# Patient Record
Sex: Female | Born: 1940 | Race: White | Hispanic: No | Smoking: Former smoker
Health system: Southern US, Community
[De-identification: ages and names within clinical notes are randomized; demographics above are authoritative.]

## PROBLEM LIST (undated history)

## (undated) DIAGNOSIS — E785 Hyperlipidemia, unspecified: Secondary | ICD-10-CM

## (undated) DIAGNOSIS — N289 Disorder of kidney and ureter, unspecified: Secondary | ICD-10-CM

## (undated) DIAGNOSIS — I10 Essential (primary) hypertension: Secondary | ICD-10-CM

## (undated) DIAGNOSIS — G4730 Sleep apnea, unspecified: Secondary | ICD-10-CM

## (undated) DIAGNOSIS — K5730 Diverticulosis of large intestine without perforation or abscess without bleeding: Secondary | ICD-10-CM

## (undated) DIAGNOSIS — F419 Anxiety disorder, unspecified: Secondary | ICD-10-CM

## (undated) HISTORY — DX: Diverticulosis of large intestine without perforation or abscess without bleeding: K57.30

## (undated) HISTORY — DX: Sleep apnea, unspecified: G47.30

## (undated) HISTORY — DX: Essential (primary) hypertension: I10

## (undated) HISTORY — DX: Anxiety disorder, unspecified: F41.9

## (undated) HISTORY — PX: TOTAL ABDOMINAL HYSTERECTOMY W/ BILATERAL SALPINGOOPHORECTOMY: SHX83

## (undated) HISTORY — DX: Hyperlipidemia, unspecified: E78.5

---

## 1961-06-04 HISTORY — PX: SALPINGOOPHORECTOMY: SHX82

## 1961-06-04 HISTORY — PX: UTERINE SUSPENSION: SUR1430

## 2007-03-09 ENCOUNTER — Emergency Department (HOSPITAL_COMMUNITY): Admission: EM | Admit: 2007-03-09 | Discharge: 2007-03-09 | Payer: Self-pay | Admitting: Emergency Medicine

## 2007-08-02 ENCOUNTER — Emergency Department (HOSPITAL_COMMUNITY): Admission: EM | Admit: 2007-08-02 | Discharge: 2007-08-02 | Payer: Self-pay | Admitting: Emergency Medicine

## 2007-09-17 ENCOUNTER — Encounter: Admission: RE | Admit: 2007-09-17 | Discharge: 2007-09-17 | Payer: Self-pay | Admitting: Internal Medicine

## 2009-06-04 HISTORY — PX: CATARACT EXTRACTION: SUR2

## 2011-02-10 ENCOUNTER — Emergency Department (HOSPITAL_COMMUNITY)
Admission: EM | Admit: 2011-02-10 | Discharge: 2011-02-11 | Disposition: A | Discharge status: Home / Self Care | Payer: Medicare Other | Attending: Emergency Medicine | Admitting: Emergency Medicine

## 2011-02-10 DIAGNOSIS — I498 Other specified cardiac arrhythmias: Secondary | ICD-10-CM | POA: Insufficient documentation

## 2011-02-10 DIAGNOSIS — E785 Hyperlipidemia, unspecified: Secondary | ICD-10-CM | POA: Insufficient documentation

## 2011-02-10 DIAGNOSIS — R110 Nausea: Secondary | ICD-10-CM | POA: Insufficient documentation

## 2011-02-10 DIAGNOSIS — I10 Essential (primary) hypertension: Secondary | ICD-10-CM | POA: Insufficient documentation

## 2011-02-10 DIAGNOSIS — J45909 Unspecified asthma, uncomplicated: Secondary | ICD-10-CM | POA: Insufficient documentation

## 2011-02-10 LAB — BASIC METABOLIC PANEL
CO2: 29 mEq/L (ref 19–32)
Calcium: 9.3 mg/dL (ref 8.4–10.5)
Creatinine, Ser: 0.61 mg/dL (ref 0.50–1.10)
Glucose, Bld: 120 mg/dL — ABNORMAL HIGH (ref 70–99)

## 2011-02-10 LAB — CBC
HCT: 38.2 % (ref 36.0–46.0)
MCHC: 33.5 g/dL (ref 30.0–36.0)
MCV: 88.2 fL (ref 78.0–100.0)
RDW: 13 % (ref 11.5–15.5)

## 2011-02-10 LAB — DIFFERENTIAL
Basophils Absolute: 0 10*3/uL (ref 0.0–0.1)
Eosinophils Relative: 1 % (ref 0–5)
Lymphocytes Relative: 19 % (ref 12–46)
Monocytes Absolute: 0.7 10*3/uL (ref 0.1–1.0)

## 2011-02-10 LAB — POCT I-STAT TROPONIN I

## 2011-02-23 LAB — CBC
MCHC: 33.2
MCV: 89.2
Platelets: 373
RBC: 4.86

## 2011-02-23 LAB — COMPREHENSIVE METABOLIC PANEL
AST: 34
BUN: 18
CO2: 26
Calcium: 8.8
Creatinine, Ser: 0.9
GFR calc Af Amer: >60
GFR calc non Af Amer: >60

## 2011-02-23 LAB — DIFFERENTIAL
Eosinophils Relative: 0
Lymphocytes Relative: 22
Lymphs Abs: 1.8
Neutro Abs: 5.9

## 2011-02-23 LAB — URINALYSIS, ROUTINE W REFLEX MICROSCOPIC
Glucose, UA: NEGATIVE
Ketones, ur: 15 — AB
Protein, ur: 100 — AB

## 2011-02-23 LAB — URINE CULTURE: Colony Count: 50000

## 2011-02-23 LAB — URINE MICROSCOPIC-ADD ON

## 2011-02-23 LAB — POCT CARDIAC MARKERS
CKMB, poc: <1 — ABNORMAL LOW
Myoglobin, poc: 82.4
Operator id: 196461
Troponin i, poc: <0.05

## 2011-05-23 ENCOUNTER — Ambulatory Visit (AMBULATORY_SURGERY_CENTER): Payer: Medicare Other | Admitting: *Deleted

## 2011-05-23 VITALS — Ht <= 58 in | Wt 136.2 lb

## 2011-05-23 DIAGNOSIS — Z1211 Encounter for screening for malignant neoplasm of colon: Secondary | ICD-10-CM

## 2011-05-23 MED ORDER — MOVIPREP 100 G PO SOLR: ORAL | Status: DC

## 2011-05-23 NOTE — Progress Notes (Signed)
Flex sig in PA-doesn't recall results-1982

## 2011-05-24 ENCOUNTER — Encounter: Payer: Self-pay | Admitting: Internal Medicine

## 2011-06-07 ENCOUNTER — Ambulatory Visit (AMBULATORY_SURGERY_CENTER): Payer: Medicare Other | Admitting: Internal Medicine

## 2011-06-07 ENCOUNTER — Encounter: Payer: Self-pay | Admitting: Internal Medicine

## 2011-06-07 VITALS — BP 131/73 | HR 106 | Temp 98.8°F | Resp 24 | Ht <= 58 in | Wt 136.0 lb

## 2011-06-07 DIAGNOSIS — Z1211 Encounter for screening for malignant neoplasm of colon: Secondary | ICD-10-CM

## 2011-06-07 MED ORDER — SODIUM CHLORIDE 0.9 % IV SOLN: 500.0000 mL | INTRAVENOUS | Status: DC

## 2011-06-07 NOTE — Op Note (Signed)
[facility] [address] [place], [place]  [address]  COLONOSCOPY PROCEDURE REPORT  PATIENT:  Laura Dominguez, Laura Dominguez  MR#:  [id number] BIRTHDATE:  Sep 01, 1940, 70 yrs. old  GENDER:  female ENDOSCOPIST:  Hedwig Morton. Juanda Chance, MD REF. BY:  Georgianne Fick, M.D. PROCEDURE DATE:  06/07/2011 PROCEDURE:  Colonoscopy 19147 ASA CLASS:  Class I INDICATIONS:  colorectal cancer screening, average risk MEDICATIONS:   These medications were titrated to patient response per physician's verbal order, Versed 10 mg, Fentanyl 100 mcg  DESCRIPTION OF PROCEDURE:   After the risks and benefits and of the procedure were explained, informed consent was obtained. Digital rectal exam was performed and revealed no rectal masses. The LB [id number]-[id number] endoscope was introduced through the anus and advanced to the cecum, which was identified by both the appendix and ileocecal valve.  The quality of the prep was good, using MoviPrep.  The instrument was then slowly withdrawn as the colon was fully examined. <<PROCEDUREIMAGES>>  FINDINGS:  No polyps or cancers were seen (see image1, image2, image3, and image4).   Retroflexed views in the rectum revealed no abnormalities.    The scope was then withdrawn from the patient and the procedure completed.  COMPLICATIONS:  None ENDOSCOPIC IMPRESSION: 1) No polyps or cancers 2) Normal colonoscopy RECOMMENDATIONS: 1) High fiber diet.  REPEAT EXAM:  In 10 year(s) for.  ______________________________ Hedwig Morton. Juanda Chance, MD  CC:  n. eSIGNED:   Hedwig Morton. Verba Ainley at 06/07/2011 10:13 AM  [place], [phone]

## 2011-06-07 NOTE — Progress Notes (Signed)
Patient did not experience any of the following events: a burn prior to discharge; a fall within the facility; wrong site/side/patient/procedure/implant event; or a hospital transfer or hospital admission upon discharge from the facility. (G8907) Patient did not have preoperative order for IV antibiotic SSI prophylaxis. (G8918)  

## 2011-06-07 NOTE — Patient Instructions (Signed)
Discharge instructions given with verbal understanding.  Normal exam.  Resume previous medications. 

## 2011-06-08 ENCOUNTER — Telehealth: Payer: Self-pay | Admitting: *Deleted

## 2011-06-08 NOTE — Telephone Encounter (Signed)

## 2014-10-19 DIAGNOSIS — I10 Essential (primary) hypertension: Secondary | ICD-10-CM | POA: Diagnosis not present

## 2014-10-19 DIAGNOSIS — R7301 Impaired fasting glucose: Secondary | ICD-10-CM | POA: Diagnosis not present

## 2014-10-19 DIAGNOSIS — E782 Mixed hyperlipidemia: Secondary | ICD-10-CM | POA: Diagnosis not present

## 2014-10-26 DIAGNOSIS — G4733 Obstructive sleep apnea (adult) (pediatric): Secondary | ICD-10-CM | POA: Diagnosis not present

## 2014-10-26 DIAGNOSIS — M810 Age-related osteoporosis without current pathological fracture: Secondary | ICD-10-CM | POA: Diagnosis not present

## 2014-10-26 DIAGNOSIS — E782 Mixed hyperlipidemia: Secondary | ICD-10-CM | POA: Diagnosis not present

## 2014-10-26 DIAGNOSIS — I10 Essential (primary) hypertension: Secondary | ICD-10-CM | POA: Diagnosis not present

## 2014-11-11 DIAGNOSIS — L720 Epidermal cyst: Secondary | ICD-10-CM | POA: Diagnosis not present

## 2014-11-11 DIAGNOSIS — L739 Follicular disorder, unspecified: Secondary | ICD-10-CM | POA: Diagnosis not present

## 2014-11-11 DIAGNOSIS — L570 Actinic keratosis: Secondary | ICD-10-CM | POA: Diagnosis not present

## 2014-11-11 DIAGNOSIS — Z85828 Personal history of other malignant neoplasm of skin: Secondary | ICD-10-CM | POA: Diagnosis not present

## 2014-11-11 DIAGNOSIS — D485 Neoplasm of uncertain behavior of skin: Secondary | ICD-10-CM | POA: Diagnosis not present

## 2014-11-11 DIAGNOSIS — L814 Other melanin hyperpigmentation: Secondary | ICD-10-CM | POA: Diagnosis not present

## 2014-11-11 DIAGNOSIS — D1801 Hemangioma of skin and subcutaneous tissue: Secondary | ICD-10-CM | POA: Diagnosis not present

## 2014-11-11 DIAGNOSIS — L738 Other specified follicular disorders: Secondary | ICD-10-CM | POA: Diagnosis not present

## 2015-04-01 DIAGNOSIS — Z23 Encounter for immunization: Secondary | ICD-10-CM | POA: Diagnosis not present

## 2015-04-19 DIAGNOSIS — Z1231 Encounter for screening mammogram for malignant neoplasm of breast: Secondary | ICD-10-CM | POA: Diagnosis not present

## 2015-06-09 DIAGNOSIS — E782 Mixed hyperlipidemia: Secondary | ICD-10-CM | POA: Diagnosis not present

## 2015-06-09 DIAGNOSIS — I10 Essential (primary) hypertension: Secondary | ICD-10-CM | POA: Diagnosis not present

## 2015-06-09 DIAGNOSIS — G4733 Obstructive sleep apnea (adult) (pediatric): Secondary | ICD-10-CM | POA: Diagnosis not present

## 2015-06-09 DIAGNOSIS — M810 Age-related osteoporosis without current pathological fracture: Secondary | ICD-10-CM | POA: Diagnosis not present

## 2015-06-09 DIAGNOSIS — N390 Urinary tract infection, site not specified: Secondary | ICD-10-CM | POA: Diagnosis not present

## 2015-06-09 DIAGNOSIS — Z0000 Encounter for general adult medical examination without abnormal findings: Secondary | ICD-10-CM | POA: Diagnosis not present

## 2015-06-09 DIAGNOSIS — E559 Vitamin D deficiency, unspecified: Secondary | ICD-10-CM | POA: Diagnosis not present

## 2015-06-16 DIAGNOSIS — I10 Essential (primary) hypertension: Secondary | ICD-10-CM | POA: Diagnosis not present

## 2015-06-16 DIAGNOSIS — E782 Mixed hyperlipidemia: Secondary | ICD-10-CM | POA: Diagnosis not present

## 2015-06-16 DIAGNOSIS — G4733 Obstructive sleep apnea (adult) (pediatric): Secondary | ICD-10-CM | POA: Diagnosis not present

## 2015-06-16 DIAGNOSIS — K5730 Diverticulosis of large intestine without perforation or abscess without bleeding: Secondary | ICD-10-CM | POA: Diagnosis not present

## 2016-01-27 DIAGNOSIS — Z961 Presence of intraocular lens: Secondary | ICD-10-CM | POA: Diagnosis not present

## 2016-01-27 DIAGNOSIS — H35371 Puckering of macula, right eye: Secondary | ICD-10-CM | POA: Diagnosis not present

## 2016-01-27 DIAGNOSIS — H40013 Open angle with borderline findings, low risk, bilateral: Secondary | ICD-10-CM | POA: Diagnosis not present

## 2016-03-02 DIAGNOSIS — Z23 Encounter for immunization: Secondary | ICD-10-CM | POA: Diagnosis not present

## 2016-08-06 DIAGNOSIS — N390 Urinary tract infection, site not specified: Secondary | ICD-10-CM | POA: Diagnosis not present

## 2016-08-06 DIAGNOSIS — K5730 Diverticulosis of large intestine without perforation or abscess without bleeding: Secondary | ICD-10-CM | POA: Diagnosis not present

## 2016-08-06 DIAGNOSIS — E782 Mixed hyperlipidemia: Secondary | ICD-10-CM | POA: Diagnosis not present

## 2016-08-06 DIAGNOSIS — Z0000 Encounter for general adult medical examination without abnormal findings: Secondary | ICD-10-CM | POA: Diagnosis not present

## 2016-08-06 DIAGNOSIS — I10 Essential (primary) hypertension: Secondary | ICD-10-CM | POA: Diagnosis not present

## 2016-08-17 DIAGNOSIS — E782 Mixed hyperlipidemia: Secondary | ICD-10-CM | POA: Diagnosis not present

## 2016-08-17 DIAGNOSIS — K5730 Diverticulosis of large intestine without perforation or abscess without bleeding: Secondary | ICD-10-CM | POA: Diagnosis not present

## 2016-08-17 DIAGNOSIS — G4733 Obstructive sleep apnea (adult) (pediatric): Secondary | ICD-10-CM | POA: Diagnosis not present

## 2016-08-17 DIAGNOSIS — Z23 Encounter for immunization: Secondary | ICD-10-CM | POA: Diagnosis not present

## 2016-08-17 DIAGNOSIS — I10 Essential (primary) hypertension: Secondary | ICD-10-CM | POA: Diagnosis not present

## 2016-09-05 DIAGNOSIS — Z1231 Encounter for screening mammogram for malignant neoplasm of breast: Secondary | ICD-10-CM | POA: Diagnosis not present

## 2016-10-30 DIAGNOSIS — G4733 Obstructive sleep apnea (adult) (pediatric): Secondary | ICD-10-CM | POA: Diagnosis not present

## 2016-11-01 ENCOUNTER — Ambulatory Visit (INDEPENDENT_AMBULATORY_CARE_PROVIDER_SITE_OTHER): Payer: Medicare Other | Admitting: Podiatry

## 2016-11-01 ENCOUNTER — Ambulatory Visit (INDEPENDENT_AMBULATORY_CARE_PROVIDER_SITE_OTHER): Payer: Medicare Other

## 2016-11-01 ENCOUNTER — Encounter: Payer: Self-pay | Admitting: Podiatry

## 2016-11-01 VITALS — BP 107/67 | HR 89 | Resp 16 | Ht 58.5 in | Wt 135.0 lb

## 2016-11-01 DIAGNOSIS — M25571 Pain in right ankle and joints of right foot: Secondary | ICD-10-CM

## 2016-11-01 DIAGNOSIS — M7750 Other enthesopathy of unspecified foot: Secondary | ICD-10-CM | POA: Diagnosis not present

## 2016-11-01 DIAGNOSIS — M79672 Pain in left foot: Secondary | ICD-10-CM

## 2016-11-01 DIAGNOSIS — G5762 Lesion of plantar nerve, left lower limb: Secondary | ICD-10-CM | POA: Diagnosis not present

## 2016-11-01 DIAGNOSIS — G5782 Other specified mononeuropathies of left lower limb: Secondary | ICD-10-CM

## 2016-11-01 DIAGNOSIS — M779 Enthesopathy, unspecified: Secondary | ICD-10-CM

## 2016-11-01 NOTE — Progress Notes (Signed)
   Subjective:    Patient ID: Laura Dominguez, female    DOB: 11-17-1940, 76 y.o.   MRN: [id number]  HPI Chief Complaint  Patient presents with  . Foot Pain    Left foot; plantar forefoot; pt stated, "Took off a callous last night and foot still hurts"  . Toe Pain    Left foot; 3rd & 4th toe; pt stated, "Has a prickly and numbing feel to toes - pain has been on and off; has had for years"  . Ankle Pain    Right foot; lateral side; pt stated, "Has sciatic nerve problems and thinks might be affecting their foot"; x4-5 weeks      Review of Systems  All other systems reviewed and are negative.      Objective:   Physical Exam        Assessment & Plan:

## 2016-11-01 NOTE — Progress Notes (Signed)
Subjective:    Patient ID: Laura Dominguez, female   DOB: 76 y.o.   MRN: [id number]   HPI patient states that she's getting shooting pain in her left forefoot and her bones are exposed bilateral with prominence noted left over right    Review of Systems  All other systems reviewed and are negative.       Objective:  Physical Exam  Constitutional: She is oriented to person, place, and time.  Cardiovascular: Intact distal pulses.   Musculoskeletal: Normal range of motion.  Neurological: She is alert and oriented to person, place, and time.  Skin: Skin is warm.  Nursing note and vitals reviewed.  neurovascular status intact muscle strength adequate range of motion within normal limits with patient found to have prominent metatarsals bilateral with diminished fat pad and inflammation and shooting pains between the third and fourth toes left foot with radiating discomforts into the adjacent digits. 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 I then did a sterile prep of the left forefoot and injected directly into the nerve prior to breaking his digital branches third interspace with a purified alcohol Marcaine solution  X-ray indicates quite a bit of osteoporosis and arthritis with no indication of stress fracture or acute fracture

## 2016-11-22 ENCOUNTER — Encounter: Payer: Self-pay | Admitting: Podiatry

## 2016-11-22 ENCOUNTER — Ambulatory Visit (INDEPENDENT_AMBULATORY_CARE_PROVIDER_SITE_OTHER): Payer: Medicare Other | Admitting: Podiatry

## 2016-11-22 DIAGNOSIS — M779 Enthesopathy, unspecified: Secondary | ICD-10-CM | POA: Diagnosis not present

## 2016-11-22 DIAGNOSIS — G5762 Lesion of plantar nerve, left lower limb: Secondary | ICD-10-CM

## 2016-11-22 DIAGNOSIS — G5782 Other specified mononeuropathies of left lower limb: Secondary | ICD-10-CM

## 2016-11-22 NOTE — Progress Notes (Signed)
Subjective:    Patient ID: Laura Dominguez, female   DOB: 76 y.o.   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

## 2016-12-13 ENCOUNTER — Encounter: Payer: Self-pay | Admitting: Podiatry

## 2016-12-13 ENCOUNTER — Ambulatory Visit (INDEPENDENT_AMBULATORY_CARE_PROVIDER_SITE_OTHER): Payer: Medicare Other | Admitting: Podiatry

## 2016-12-13 DIAGNOSIS — G5762 Lesion of plantar nerve, left lower limb: Secondary | ICD-10-CM

## 2016-12-13 DIAGNOSIS — M779 Enthesopathy, unspecified: Secondary | ICD-10-CM

## 2016-12-13 DIAGNOSIS — G5782 Other specified mononeuropathies of left lower limb: Secondary | ICD-10-CM

## 2016-12-13 NOTE — Progress Notes (Signed)
Subjective:    Patient ID: Laura Dominguez, female   DOB: 76 y.o.   MRN: [id number]   HPI patient presents stating that the left foot is doing better and has occasional shooting pain and orthotics are really helping her    ROS      Objective:  Physical Exam neurovascular status intact with continued shooting pain thirdleft with improvement but still present and diminished discomfort plantar aspect both feet     Assessment:   Doing well with capsular like inflammation with neuroma symptomatology present left      Plan:    Reviewed both conditions and today I went ahead and I did do an injection of the left third interspace with a purified alcohol Marcaine solution and she will have second pair of orthotics made so she can keep them in different shoes

## 2016-12-27 ENCOUNTER — Telehealth: Payer: Self-pay | Admitting: Podiatry

## 2016-12-27 NOTE — Telephone Encounter (Signed)
lvm for pt that 2nd pair of orthotics in and ok to pick up

## 2017-01-10 ENCOUNTER — Encounter: Payer: Self-pay | Admitting: Neurology

## 2017-01-14 ENCOUNTER — Ambulatory Visit (INDEPENDENT_AMBULATORY_CARE_PROVIDER_SITE_OTHER): Payer: Medicare Other | Admitting: Neurology

## 2017-01-14 ENCOUNTER — Encounter: Payer: Self-pay | Admitting: Neurology

## 2017-01-14 VITALS — BP 118/72 | HR 70 | Ht <= 58 in | Wt 140.0 lb

## 2017-01-14 DIAGNOSIS — R351 Nocturia: Secondary | ICD-10-CM | POA: Diagnosis not present

## 2017-01-14 DIAGNOSIS — F5102 Adjustment insomnia: Secondary | ICD-10-CM | POA: Diagnosis not present

## 2017-01-14 DIAGNOSIS — I9788 Other intraoperative complications of the circulatory system, not elsewhere classified: Secondary | ICD-10-CM | POA: Insufficient documentation

## 2017-01-14 DIAGNOSIS — G4733 Obstructive sleep apnea (adult) (pediatric): Secondary | ICD-10-CM | POA: Diagnosis not present

## 2017-01-14 DIAGNOSIS — R0902 Hypoxemia: Secondary | ICD-10-CM | POA: Diagnosis not present

## 2017-01-14 DIAGNOSIS — Z9989 Dependence on other enabling machines and devices: Principal | ICD-10-CM

## 2017-01-14 NOTE — Progress Notes (Signed)
SLEEP MEDICINE CLINIC   Provider:  Larey Seat, [place] D  Primary Care Physician:  Merrilee Seashore, MD   Referring Provider: Merrilee Seashore, MD    Chief Complaint  Patient presents with  . New Patient (Initial Visit)    pt alone, CPAP user for 11 years baseline sleep Dominguez was done in [place]. pt is in need of new machine and having to establish care here and be seen in office. currently getting supplies thru [facility]    HPI:  Laura Dominguez is a 76 y.o. female , seen here as in a referral  from Dr. Ashby Dawes for Transfer of CPAP care,  Laura Dominguez is a 76 year old right-handed Caucasian married female and was until recently  a compliant CPAP user. Her sleep Dominguez and her prescription for CPAP originally established in [place]. Laura Dominguez reports that she was hospitalized 11 years ago, had an extensive cardiology workup including telemetry monitoring with pulse oximetry. Pulse clip hurt her finger, and she would take it off. She also had a chemical stress test and shortly after was given a calcium channel blocker which led to a CODE BLUE. It was during that hospitalization, when she was placed on nocturnal oxygen and she believes that that let to her being diagnosed as having obstructive sleep apnea, but she doesn't really believe she had. Her CPAP apparatus is no longer functioning and is not longer usable. She has noticed no increased fatigue, decreased sleep quality and excessive daytime sleepiness since she has been off CPAP. She was CPAP compliance before her machine broke. She needs a new evaluation to confirm the diagnosis of sleep apnea, the degree of sleep apnea and a re-titration to find the best possible pressure in therapy at this time. The CPAP machine broke in May 2018 - so she has now been 3 months without therapy.   Chief complaint according to patient : "I am getting more sleepy- as I was before CPAP ", DME is [facility]   Sleep habits are as follows: her  bedtime is around 9 PM, she does not have trouble falling asleep. She sleeps on her side sometimes on her back, with one pillow. Her bedroom is cool quiet and dark. She shared a bedroom with her husband until he moved to memory care last week. This last week has been difficult for her, the couple has been married for 56 years. She has bathroom breaks twice at night and wakes up around 7 AM usually spontaneous. She gets more than 8 hours of nocturnal sleep.  When she wakes up she feels usually refreshed and restored, but has a dry mouth since she isnt using CPAP. Marland Kitchen She will sometimes rest of the couch in the afternoons, but this is not a weekly habit.   Sleep medical history and family sleep history: She is not aware of any other family members suffering from sleep apnea, she never had nasal, ENT or neck surgery no history of traumatic brain injury. 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    Social history: married, the couple met when they were 18 and 79 years old. 3 adult children, 8 grandchildren and 5 great grand children. No special diet. No alcohol, no tobacco use, Caffeine : soda- 2 a day.   Review of Systems: Out of a complete 14 system review, the patient complains of only the following symptoms, and all other reviewed systems are negative.  I don't snore, I don't think I still have apnea, I don't know for sure I have  had apnea.  Epworth score 1 , Fatigue severity score 17  , depression score 3/15    Social History   Social History  . Marital status: Married    Spouse name: N/A  . Number of children: N/A  . Years of education: N/A   Occupational History  . Not on file.   Social History Main Topics  . Smoking status: Former Smoker    Types: Cigarettes    Quit date: 05/22/1964  . Smokeless tobacco: Never Used  . Alcohol use No  . Drug use: No  . Sexual activity: Not on file   Other Topics Concern  . Not on file   Social History Narrative  . No narrative on file    Family  History  Problem Relation Age of Onset  . Prostate cancer Father   . Diabetes Brother   . Colon cancer Neg Hx     Past Medical History:  Diagnosis Date  . Anxiety   . Diverticula of colon   . Hyperlipidemia   . Hypertension   . Hypertension   . Sleep apnea     Past Surgical History:  Procedure Laterality Date  . CATARACT EXTRACTION  2011   both eyes  . SALPINGOOPHORECTOMY  1963   partial  . TOTAL ABDOMINAL HYSTERECTOMY W/ BILATERAL SALPINGOOPHORECTOMY    . UTERINE SUSPENSION  1963    Current Outpatient Prescriptions  Medication Sig Dispense Refill  . atorvastatin (LIPITOR) 10 MG tablet Take 20 mg by mouth daily.     . cholecalciferol (VITAMIN D) 1000 units tablet Take 1,000 Units by mouth daily.    . flurazepam (DALMANE) 15 MG capsule Take 15 mg by mouth at bedtime as needed.  2  . lisinopril (PRINIVIL,ZESTRIL) 40 MG tablet Take 40 mg by mouth daily.  3  . sertraline (ZOLOFT) 100 MG tablet Take 100 mg by mouth daily.  3   No current facility-administered medications for this visit.     Allergies as of 01/14/2017 - Review Complete 01/14/2017  Allergen Reaction Noted  . Antihistamines, diphenhydramine-type Palpitations 05/23/2011  . Avelox [moxifloxacin hcl in nacl] Other (See Comments) 05/23/2011  . Calcium channel blockers Anaphylaxis 05/23/2011  . Erythrocin Other (See Comments) 05/23/2011  . Macrodantin Diarrhea 05/23/2011    Vitals: BP 118/72   Pulse 70   Ht _0  (1.473 m)   Wt 140 lb (63.5 kg)   BMI 29.26 kg/m   Last Weight:  Wt Readings from Last 1 Encounters:  01/14/17 140 lb (63.5 kg)   BWL:SLHT mass index is 29.26 kg/m.     Last Height:   Ht Readings from Last 1 Encounters:  01/14/17 _1  (1.473 m)    Physical exam:  General: The patient is awake, alert and appears not in acute distress. The patient is well groomed. Head: Normocephalic, atraumatic. Neck is supple. Mallampati 1,  neck circumference: 13 . Nasal airflow patent , Retrognathia  is not seen.  Cardiovascular:  Regular rate and rhythm , without  murmurs or carotid bruit, and without distended neck veins. Respiratory: Lungs are clear to auscultation. Skin:  Without evidence of edema, or rash Trunk: BMI is 29. The patient's posture is erect  Neurologic exam : The patient is awake and alert, oriented to place and time.   Attention span & concentration ability appears normal.  Speech is fluent,  without dysarthria, dysphonia or aphasia.  Mood and affect are appropriate.  Cranial nerves: Pupils are equal and briskly reactive to light. Funduscopic exam  without evidence of pallor or edema. Extraocular movements  in vertical and horizontal planes intact and without nystagmus. Visual fields by finger perimetry are intact. Hearing to finger rub intact.   Facial sensation intact to fine touch.  Facial motor strength is symmetric and tongue and uvula move midline. Shoulder shrug was symmetrical.   Motor exam: Normal tone, muscle bulk and symmetric strength in all extremities. Sensory:  Fine touch, pinprick and vibration were tested in all extremities. Proprioception tested in the upper extremities was normal. Coordination: Rapid alternating movements in the fingers/hands was normal. Finger-to-nose maneuver  normal without evidence of ataxia, dysmetria or tremor.  Gait and station: Patient walks without assistive device . Strength within normal limits.  Stance is stable and normal.    Deep tendon reflexes: in the  upper and lower extremities are symmetric and intact. Babinski maneuver response is downgoing.   I would like to thank Dr. Ashby Dawes for allowing me to participate in Laura Dominguez's  care,   Dear Mauro Kaufmann, Since we do not have original data from her hospitalization and sleep Dominguez. [facility] supposedly sent to duplication of the Dominguez to a local office. I will ask Linzie Collin to try to locate. I will invite Laura Dominguez with the goal to confirm  the diagnosis, and if apnea is still present, to initiate new treatment. She would like to do it hame - I will investigate if medicare allows this.   Assessment:  After physical and neurologic examination, review of laboratory studies,  Personal review of imaging studies, reports of other /same  Imaging studies, results of polysomnography and / or neurophysiology testing and pre-existing records as far as provided in visit., my assessment is   1)  Possible mild sleep apnea. Airway and BMI are not posing a high risk.   2)  possible hypoxemia- it seemed to the patient that this was the reason for her CPAP prescription in the first place.    The patient was advised of the nature of the diagnosed disorder , the treatment options and the  risks for general health and wellness arising from not treating the condition.   I spent more than 35 minutes of face to face time with the patient.  Greater than 50% of time was spent in counseling and coordination of care. We have discussed the diagnosis and differential and I answered the patient's questions.    Plan:  Treatment plan and additional workup :  Attended SLEEP Dominguez with titration if AHI over 20. RV with me.   Larey Seat, MD 5/79/7282, 0:60 PM  Certified in Neurology by ABPN Certified in [place] by [facility] [address], [place], [place] [address]

## 2017-01-14 NOTE — Patient Instructions (Signed)
Please remember to try to maintain good sleep hygiene, which means: Keep a regular sleep and wake schedule, try not to exercise or have a meal within 2 hours of your bedtime, try to keep your bedroom conducive for sleep, that is, cool and dark, without light distractors such as an illuminated alarm clock, and refrain from watching TV right before sleep or in the middle of the night and do not keep the TV or radio on during the night. Also, try not to use or play on electronic devices at bedtime, such as your cell phone, tablet PC or laptop. If you like to read at bedtime on an electronic device, try to dim the background light as much as possible. Do not eat in the middle of the night.   We will request a sleep study.    We will look for  snoring or sleep apnea.   For chronic insomnia, you are best followed by a psychiatrist and/or sleep psychologist.   We will call you with the sleep study results and make a follow up appointment if needed.    

## 2017-01-30 ENCOUNTER — Telehealth: Payer: Self-pay | Admitting: Neurology

## 2017-01-30 NOTE — Telephone Encounter (Signed)
We have attempted to call the patient 3 times to schedule sleep study. Patient has been unavailable at the phone numbers we have on file and has not returned our calls. At this point we will send a letter asking pt to please contact the sleep lab to schedule their sleep study. If patient calls back we will schedule them for their sleep study. ° °

## 2017-03-13 DIAGNOSIS — Z23 Encounter for immunization: Secondary | ICD-10-CM | POA: Diagnosis not present

## 2017-09-05 DIAGNOSIS — J0100 Acute maxillary sinusitis, unspecified: Secondary | ICD-10-CM | POA: Diagnosis not present

## 2017-09-09 DIAGNOSIS — I10 Essential (primary) hypertension: Secondary | ICD-10-CM | POA: Diagnosis not present

## 2017-09-09 DIAGNOSIS — G4733 Obstructive sleep apnea (adult) (pediatric): Secondary | ICD-10-CM | POA: Diagnosis not present

## 2017-09-09 DIAGNOSIS — Z0000 Encounter for general adult medical examination without abnormal findings: Secondary | ICD-10-CM | POA: Diagnosis not present

## 2017-09-09 DIAGNOSIS — E782 Mixed hyperlipidemia: Secondary | ICD-10-CM | POA: Diagnosis not present

## 2017-09-10 DIAGNOSIS — Z1231 Encounter for screening mammogram for malignant neoplasm of breast: Secondary | ICD-10-CM | POA: Diagnosis not present

## 2017-09-15 DIAGNOSIS — L509 Urticaria, unspecified: Secondary | ICD-10-CM | POA: Diagnosis not present

## 2017-09-15 DIAGNOSIS — L500 Allergic urticaria: Secondary | ICD-10-CM | POA: Diagnosis not present

## 2017-09-16 DIAGNOSIS — L500 Allergic urticaria: Secondary | ICD-10-CM | POA: Diagnosis not present

## 2017-09-16 DIAGNOSIS — I10 Essential (primary) hypertension: Secondary | ICD-10-CM | POA: Diagnosis not present

## 2017-09-16 DIAGNOSIS — K5730 Diverticulosis of large intestine without perforation or abscess without bleeding: Secondary | ICD-10-CM | POA: Diagnosis not present

## 2017-09-16 DIAGNOSIS — F411 Generalized anxiety disorder: Secondary | ICD-10-CM | POA: Diagnosis not present

## 2017-09-16 DIAGNOSIS — G4733 Obstructive sleep apnea (adult) (pediatric): Secondary | ICD-10-CM | POA: Diagnosis not present

## 2017-09-16 DIAGNOSIS — E782 Mixed hyperlipidemia: Secondary | ICD-10-CM | POA: Diagnosis not present

## 2017-09-16 DIAGNOSIS — M810 Age-related osteoporosis without current pathological fracture: Secondary | ICD-10-CM | POA: Diagnosis not present

## 2017-10-17 DIAGNOSIS — L918 Other hypertrophic disorders of the skin: Secondary | ICD-10-CM | POA: Diagnosis not present

## 2017-10-17 DIAGNOSIS — D2339 Other benign neoplasm of skin of other parts of face: Secondary | ICD-10-CM | POA: Diagnosis not present

## 2017-10-17 DIAGNOSIS — L570 Actinic keratosis: Secondary | ICD-10-CM | POA: Diagnosis not present

## 2017-10-17 DIAGNOSIS — Z85828 Personal history of other malignant neoplasm of skin: Secondary | ICD-10-CM | POA: Diagnosis not present

## 2018-02-21 DIAGNOSIS — Z23 Encounter for immunization: Secondary | ICD-10-CM | POA: Diagnosis not present

## 2018-09-29 DIAGNOSIS — I10 Essential (primary) hypertension: Secondary | ICD-10-CM | POA: Diagnosis not present

## 2018-09-29 DIAGNOSIS — G4733 Obstructive sleep apnea (adult) (pediatric): Secondary | ICD-10-CM | POA: Diagnosis not present

## 2018-09-29 DIAGNOSIS — N390 Urinary tract infection, site not specified: Secondary | ICD-10-CM | POA: Diagnosis not present

## 2018-09-29 DIAGNOSIS — E782 Mixed hyperlipidemia: Secondary | ICD-10-CM | POA: Diagnosis not present

## 2018-09-29 DIAGNOSIS — Z0000 Encounter for general adult medical examination without abnormal findings: Secondary | ICD-10-CM | POA: Diagnosis not present

## 2018-10-06 DIAGNOSIS — F411 Generalized anxiety disorder: Secondary | ICD-10-CM | POA: Diagnosis not present

## 2018-10-06 DIAGNOSIS — M810 Age-related osteoporosis without current pathological fracture: Secondary | ICD-10-CM | POA: Diagnosis not present

## 2018-10-06 DIAGNOSIS — R531 Weakness: Secondary | ICD-10-CM | POA: Diagnosis not present

## 2018-10-06 DIAGNOSIS — I10 Essential (primary) hypertension: Secondary | ICD-10-CM | POA: Diagnosis not present

## 2018-10-06 DIAGNOSIS — G4733 Obstructive sleep apnea (adult) (pediatric): Secondary | ICD-10-CM | POA: Diagnosis not present

## 2018-10-06 DIAGNOSIS — E782 Mixed hyperlipidemia: Secondary | ICD-10-CM | POA: Diagnosis not present

## 2018-10-06 DIAGNOSIS — Z7189 Other specified counseling: Secondary | ICD-10-CM | POA: Diagnosis not present

## 2018-10-06 DIAGNOSIS — G250 Essential tremor: Secondary | ICD-10-CM | POA: Diagnosis not present

## 2018-10-06 DIAGNOSIS — K5730 Diverticulosis of large intestine without perforation or abscess without bleeding: Secondary | ICD-10-CM | POA: Diagnosis not present

## 2018-11-27 DIAGNOSIS — Z1231 Encounter for screening mammogram for malignant neoplasm of breast: Secondary | ICD-10-CM | POA: Diagnosis not present

## 2019-01-01 ENCOUNTER — Other Ambulatory Visit: Payer: Self-pay

## 2019-02-13 DIAGNOSIS — Z23 Encounter for immunization: Secondary | ICD-10-CM | POA: Diagnosis not present

## 2019-04-06 DIAGNOSIS — E782 Mixed hyperlipidemia: Secondary | ICD-10-CM | POA: Diagnosis not present

## 2019-04-06 DIAGNOSIS — I10 Essential (primary) hypertension: Secondary | ICD-10-CM | POA: Diagnosis not present

## 2019-04-13 DIAGNOSIS — G250 Essential tremor: Secondary | ICD-10-CM | POA: Diagnosis not present

## 2019-04-13 DIAGNOSIS — F411 Generalized anxiety disorder: Secondary | ICD-10-CM | POA: Diagnosis not present

## 2019-04-13 DIAGNOSIS — Z7189 Other specified counseling: Secondary | ICD-10-CM | POA: Diagnosis not present

## 2019-04-13 DIAGNOSIS — I10 Essential (primary) hypertension: Secondary | ICD-10-CM | POA: Diagnosis not present

## 2019-04-13 DIAGNOSIS — E782 Mixed hyperlipidemia: Secondary | ICD-10-CM | POA: Diagnosis not present

## 2019-10-14 DIAGNOSIS — I10 Essential (primary) hypertension: Secondary | ICD-10-CM | POA: Diagnosis not present

## 2019-10-14 DIAGNOSIS — Z0000 Encounter for general adult medical examination without abnormal findings: Secondary | ICD-10-CM | POA: Diagnosis not present

## 2019-10-14 DIAGNOSIS — G250 Essential tremor: Secondary | ICD-10-CM | POA: Diagnosis not present

## 2019-10-14 DIAGNOSIS — E782 Mixed hyperlipidemia: Secondary | ICD-10-CM | POA: Diagnosis not present

## 2019-10-21 DIAGNOSIS — K5730 Diverticulosis of large intestine without perforation or abscess without bleeding: Secondary | ICD-10-CM | POA: Diagnosis not present

## 2019-10-21 DIAGNOSIS — M810 Age-related osteoporosis without current pathological fracture: Secondary | ICD-10-CM | POA: Diagnosis not present

## 2019-10-21 DIAGNOSIS — G250 Essential tremor: Secondary | ICD-10-CM | POA: Diagnosis not present

## 2019-10-21 DIAGNOSIS — E782 Mixed hyperlipidemia: Secondary | ICD-10-CM | POA: Diagnosis not present

## 2019-10-21 DIAGNOSIS — F411 Generalized anxiety disorder: Secondary | ICD-10-CM | POA: Diagnosis not present

## 2019-10-21 DIAGNOSIS — G4733 Obstructive sleep apnea (adult) (pediatric): Secondary | ICD-10-CM | POA: Diagnosis not present

## 2019-10-21 DIAGNOSIS — I10 Essential (primary) hypertension: Secondary | ICD-10-CM | POA: Diagnosis not present

## 2019-10-21 DIAGNOSIS — R7303 Prediabetes: Secondary | ICD-10-CM | POA: Diagnosis not present

## 2020-02-26 DIAGNOSIS — Z23 Encounter for immunization: Secondary | ICD-10-CM | POA: Diagnosis not present

## 2020-04-18 DIAGNOSIS — H40013 Open angle with borderline findings, low risk, bilateral: Secondary | ICD-10-CM | POA: Diagnosis not present

## 2020-04-18 DIAGNOSIS — Z961 Presence of intraocular lens: Secondary | ICD-10-CM | POA: Diagnosis not present

## 2020-04-18 DIAGNOSIS — H35373 Puckering of macula, bilateral: Secondary | ICD-10-CM | POA: Diagnosis not present

## 2020-10-01 DIAGNOSIS — I10 Essential (primary) hypertension: Secondary | ICD-10-CM | POA: Diagnosis not present

## 2020-10-01 DIAGNOSIS — G4733 Obstructive sleep apnea (adult) (pediatric): Secondary | ICD-10-CM | POA: Diagnosis not present

## 2020-10-01 DIAGNOSIS — E782 Mixed hyperlipidemia: Secondary | ICD-10-CM | POA: Diagnosis not present

## 2020-10-01 DIAGNOSIS — M810 Age-related osteoporosis without current pathological fracture: Secondary | ICD-10-CM | POA: Diagnosis not present

## 2020-10-26 DIAGNOSIS — Z0000 Encounter for general adult medical examination without abnormal findings: Secondary | ICD-10-CM | POA: Diagnosis not present

## 2020-10-26 DIAGNOSIS — E782 Mixed hyperlipidemia: Secondary | ICD-10-CM | POA: Diagnosis not present

## 2020-10-26 DIAGNOSIS — R5383 Other fatigue: Secondary | ICD-10-CM | POA: Diagnosis not present

## 2020-10-26 DIAGNOSIS — R7303 Prediabetes: Secondary | ICD-10-CM | POA: Diagnosis not present

## 2020-10-26 DIAGNOSIS — I10 Essential (primary) hypertension: Secondary | ICD-10-CM | POA: Diagnosis not present

## 2020-11-02 DIAGNOSIS — R7303 Prediabetes: Secondary | ICD-10-CM | POA: Diagnosis not present

## 2020-11-02 DIAGNOSIS — I10 Essential (primary) hypertension: Secondary | ICD-10-CM | POA: Diagnosis not present

## 2020-11-02 DIAGNOSIS — E875 Hyperkalemia: Secondary | ICD-10-CM | POA: Diagnosis not present

## 2020-11-02 DIAGNOSIS — E782 Mixed hyperlipidemia: Secondary | ICD-10-CM | POA: Diagnosis not present

## 2020-11-02 DIAGNOSIS — M810 Age-related osteoporosis without current pathological fracture: Secondary | ICD-10-CM | POA: Diagnosis not present

## 2020-11-02 DIAGNOSIS — Z23 Encounter for immunization: Secondary | ICD-10-CM | POA: Diagnosis not present

## 2020-11-02 DIAGNOSIS — K5730 Diverticulosis of large intestine without perforation or abscess without bleeding: Secondary | ICD-10-CM | POA: Diagnosis not present

## 2020-11-03 DIAGNOSIS — E875 Hyperkalemia: Secondary | ICD-10-CM | POA: Diagnosis not present

## 2020-11-23 DIAGNOSIS — I10 Essential (primary) hypertension: Secondary | ICD-10-CM | POA: Diagnosis not present

## 2020-11-23 DIAGNOSIS — E782 Mixed hyperlipidemia: Secondary | ICD-10-CM | POA: Diagnosis not present

## 2020-11-23 DIAGNOSIS — R7303 Prediabetes: Secondary | ICD-10-CM | POA: Diagnosis not present

## 2020-12-01 DIAGNOSIS — M810 Age-related osteoporosis without current pathological fracture: Secondary | ICD-10-CM | POA: Diagnosis not present

## 2020-12-01 DIAGNOSIS — I10 Essential (primary) hypertension: Secondary | ICD-10-CM | POA: Diagnosis not present

## 2020-12-01 DIAGNOSIS — G4733 Obstructive sleep apnea (adult) (pediatric): Secondary | ICD-10-CM | POA: Diagnosis not present

## 2020-12-01 DIAGNOSIS — E782 Mixed hyperlipidemia: Secondary | ICD-10-CM | POA: Diagnosis not present

## 2021-06-14 DIAGNOSIS — R7303 Prediabetes: Secondary | ICD-10-CM | POA: Diagnosis not present

## 2021-06-14 DIAGNOSIS — I10 Essential (primary) hypertension: Secondary | ICD-10-CM | POA: Diagnosis not present

## 2021-06-14 DIAGNOSIS — E782 Mixed hyperlipidemia: Secondary | ICD-10-CM | POA: Diagnosis not present

## 2021-06-21 DIAGNOSIS — R7303 Prediabetes: Secondary | ICD-10-CM | POA: Diagnosis not present

## 2021-06-21 DIAGNOSIS — M810 Age-related osteoporosis without current pathological fracture: Secondary | ICD-10-CM | POA: Diagnosis not present

## 2021-06-21 DIAGNOSIS — E782 Mixed hyperlipidemia: Secondary | ICD-10-CM | POA: Diagnosis not present

## 2021-06-21 DIAGNOSIS — K5730 Diverticulosis of large intestine without perforation or abscess without bleeding: Secondary | ICD-10-CM | POA: Diagnosis not present

## 2021-06-21 DIAGNOSIS — I10 Essential (primary) hypertension: Secondary | ICD-10-CM | POA: Diagnosis not present

## 2021-07-31 DIAGNOSIS — U071 COVID-19: Secondary | ICD-10-CM | POA: Diagnosis not present

## 2021-08-09 ENCOUNTER — Other Ambulatory Visit: Payer: Self-pay

## 2021-08-09 ENCOUNTER — Emergency Department (HOSPITAL_BASED_OUTPATIENT_CLINIC_OR_DEPARTMENT_OTHER): Payer: Medicare Other

## 2021-08-09 ENCOUNTER — Emergency Department (HOSPITAL_BASED_OUTPATIENT_CLINIC_OR_DEPARTMENT_OTHER)
Admission: EM | Admit: 2021-08-09 | Discharge: 2021-08-09 | Disposition: A | Discharge status: Home / Self Care | Payer: Medicare Other | Attending: Emergency Medicine | Admitting: Emergency Medicine

## 2021-08-09 ENCOUNTER — Encounter (HOSPITAL_BASED_OUTPATIENT_CLINIC_OR_DEPARTMENT_OTHER): Payer: Self-pay | Admitting: Emergency Medicine

## 2021-08-09 DIAGNOSIS — S8992XA Unspecified injury of left lower leg, initial encounter: Secondary | ICD-10-CM | POA: Diagnosis not present

## 2021-08-09 DIAGNOSIS — I10 Essential (primary) hypertension: Secondary | ICD-10-CM | POA: Diagnosis not present

## 2021-08-09 DIAGNOSIS — W19XXXA Unspecified fall, initial encounter: Secondary | ICD-10-CM

## 2021-08-09 DIAGNOSIS — M25562 Pain in left knee: Secondary | ICD-10-CM | POA: Diagnosis not present

## 2021-08-09 DIAGNOSIS — Y92094 Garage of other non-institutional residence as the place of occurrence of the external cause: Secondary | ICD-10-CM | POA: Insufficient documentation

## 2021-08-09 DIAGNOSIS — R000 Tachycardia, unspecified: Secondary | ICD-10-CM | POA: Insufficient documentation

## 2021-08-09 DIAGNOSIS — Z79899 Other long term (current) drug therapy: Secondary | ICD-10-CM | POA: Diagnosis not present

## 2021-08-09 DIAGNOSIS — W01198A Fall on same level from slipping, tripping and stumbling with subsequent striking against other object, initial encounter: Secondary | ICD-10-CM | POA: Diagnosis not present

## 2021-08-09 DIAGNOSIS — Y9301 Activity, walking, marching and hiking: Secondary | ICD-10-CM | POA: Diagnosis not present

## 2021-08-09 DIAGNOSIS — S0990XA Unspecified injury of head, initial encounter: Secondary | ICD-10-CM | POA: Diagnosis not present

## 2021-08-09 DIAGNOSIS — S8002XA Contusion of left knee, initial encounter: Secondary | ICD-10-CM | POA: Insufficient documentation

## 2021-08-09 DIAGNOSIS — R519 Headache, unspecified: Secondary | ICD-10-CM | POA: Diagnosis not present

## 2021-08-09 HISTORY — DX: Disorder of kidney and ureter, unspecified: N28.9

## 2021-08-09 MED ORDER — ACETAMINOPHEN 325 MG PO TABS
650.0000 mg | ORAL_TABLET | Freq: Once | ORAL | Status: AC
  Administered 2021-08-09: 650 mg via ORAL
  Filled 2021-08-09: qty 2

## 2021-08-09 NOTE — ED Provider Notes (Signed)
?[facility] EMERGENCY DEPT ?Provider Note ? ? ?CSN: [id number] ?Arrival date & time: 08/09/21  1523 ? ?  ? ?History ? ?Chief Complaint  ?Patient presents with  ? Fall  ? ? ?Laura Dominguez is a 81 y.o. female. ? ?HPI ? ? Pt is an 81 y/o female with a h/o anxiety, HLD, HTN, renal disorder, sleep apnea, who presents to the ED today for eval after a fall. She was walking in her garage when she slipped and fell  backwards. She does report head trauma but denies loc. She is c/o pain to the head and left knee.  ? ?She is not anticoagulated. ? ? ?Home Medications ?Prior to Admission medications   ?Medication Sig Start Date End Date Taking? Authorizing Provider  ?atorvastatin (LIPITOR) 10 MG tablet Take 20 mg by mouth daily.     [provider]  ?cholecalciferol (VITAMIN D) 1000 units tablet Take 1,000 Units by mouth daily.    [provider]  ?flurazepam (DALMANE) 15 MG capsule Take 15 mg by mouth at bedtime as needed. 11/28/16   [provider]  ?lisinopril (PRINIVIL,ZESTRIL) 40 MG tablet Take 40 mg by mouth daily. 10/20/16   [provider]  ?sertraline (ZOLOFT) 100 MG tablet Take 100 mg by mouth daily. 10/13/16   [provider]  ?   ? ?Allergies    ?Antihistamines, diphenhydramine-type; Avelox [moxifloxacin hcl in nacl]; Calcium channel blockers; Erythrocin; and Macrodantin   ? ?Review of Systems   ?Review of Systems ?See HPI for pertinent positives or negatives. ? ? ?Physical Exam ?Updated Vital Signs ?BP (!) 160/81   Pulse (!) 114   Temp 97.8 ?F (36.6 ?C) (Oral)   Resp 18   SpO2 94%  ?Physical Exam ?Vitals and nursing note reviewed.  ?Constitutional:   ?   General: She is not in acute distress. ?   Appearance: She is well-developed.  ?HENT:  ?   Head: Normocephalic and atraumatic.  ?Eyes:  ?   Extraocular Movements: Extraocular movements intact.  ?   Conjunctiva/sclera: Conjunctivae normal.  ?   Pupils: Pupils are equal, round, and reactive to light.   ?Cardiovascular:  ?   Rate and Rhythm: Regular rhythm. Tachycardia present.  ?   Heart sounds: Normal heart sounds. No murmur heard. ?Pulmonary:  ?   Effort: Pulmonary effort is normal. No respiratory distress.  ?   Breath sounds: Normal breath sounds.  ?Abdominal:  ?   General: Bowel sounds are normal.  ?   Palpations: Abdomen is soft.  ?   Tenderness: There is no abdominal tenderness.  ?Musculoskeletal:     ?   General: No swelling.  ?   Cervical back: Neck supple.  ?   Comments: TTP to the left patella with faint ecchymosis. No TTP to the tib/fib. Nvi distally. No TTP to the bilat hips. No TTP to the CTL spine.   ?Skin: ?   General: Skin is warm and dry.  ?   Capillary Refill: Capillary refill takes less than 2 seconds.  ?Neurological:  ?   Mental Status: She is alert.  ?   Comments: Mental Status:  ?Alert, thought content appropriate, able to give a coherent history. Speech fluent without evidence of aphasia. Able to follow 2 step commands without difficulty.  ?Cranial Nerves:  ?II:  pupils equal, round, reactive to light ?III,IV, VI: ptosis not present, extra-ocular motions intact bilaterally  ?V,VII: smile symmetric, facial light touch sensation equal ?VIII: hearing grossly normal to voice  ?X:  uvula elevates symmetrically  ?XI: bilateral shoulder shrug symmetric and strong ?XII: midline tongue extension without fassiculations ?Motor:  ?Normal tone. grossly strength of BUE and BLE major muscle groups with exception of mildly decreased strength of the lle due to pain ?  ?Psychiatric:     ?   Mood and Affect: Mood normal.  ? ? ? ?ED Results / Procedures / Treatments   ?Labs ?(all labs ordered are listed, but only abnormal results are displayed) ?Labs Reviewed - No data to display ? ?EKG ?None ? ?Radiology ?CT Head Wo Contrast ? ?Result Date: 08/09/2021 ?CLINICAL DATA:  Head trauma, moderate to severe fell in the garage. EXAM: CT HEAD WITHOUT CONTRAST TECHNIQUE: Contiguous axial images were obtained from the base  of the skull through the vertex without intravenous contrast. RADIATION DOSE REDUCTION: This exam was performed according to the departmental dose-optimization program which includes automated exposure control, adjustment of the mA and/or kV according to patient size and/or use of iterative reconstruction technique. COMPARISON:  None. FINDINGS: Brain: No evidence of acute infarction, hemorrhage, hydrocephalus, extra-axial collection or mass lesion/mass effect. Vascular: No hyperdense vessel or unexpected calcification. Skull: Normal. Negative for fracture or focal lesion. Sinuses/Orbits: No acute finding. Other: None. IMPRESSION: 1.  No CT evidence of acute intracranial abdominal process. 2.  No evidence of acute traumatic injury. Electronically Signed   By: Keane Police D.O.   On: 08/09/2021 18:13  ? ?DG Knee Complete 4 Views Left ? ?Result Date: 08/09/2021 ?CLINICAL DATA:  Trauma, fall, pain EXAM: LEFT KNEE - COMPLETE 4+ VIEW COMPARISON:  None. FINDINGS: No fracture or dislocation is seen. Osteopenia is seen in bony structures. There is no significant effusion in the suprapatellar bursa. Vascular calcifications are seen in the soft tissues. IMPRESSION: No fracture or dislocation is seen. There is no significant effusion. Electronically Signed   By: Elmer Picker M.D.   On: 08/09/2021 15:56   ? ?Procedures ?Procedures  ? ? ?  ?Durable Medical Equipment  ?(From admission, onward)  ?  ? ? ?  ? ?  Start     Ordered  ? 08/09/21 0000  For home use only DME Walker       ?Question:  Patient needs a walker to treat with the following condition  Answer:  Fall  ? 08/09/21 1836  ? ?  ?  ? ?  ? ? ? ? ?Medications Ordered in ED ?Medications  ?acetaminophen (TYLENOL) tablet 650 mg (650 mg Oral Given 08/09/21 1645)  ? ? ?ED Course/ Medical Decision Making/ A&P ?  ?                        ?Medical Decision Making ?Amount and/or Complexity of Data Reviewed ?Radiology: ordered. ? ?Risk ?OTC drugs. ? ? ?81 year old female here for  evaluation after mechanical fall that occurred prior to arrival.  Complaining of head trauma and left knee pain.  X-ray of the left knee negative for fracture.  Patient still has significant pain with any weightbearing.  She was placed in a knee immobilizer.  She was also given a walker at home and ambulated steadily in the ED.  She is advised to follow-up with orthopedics in regards to this.  Additionally CT head was ordered to rule out skull fracture, intracranial bleeding and was negative for this.  She denies any other injuries at this time.  Feel she is appropriate for outpatient follow-up with Ortho.  Advised on return precautions.  She and  her son at bedside voiced understanding the plan and reasons to return.  All questions answered.  Patient stable for discharge. ? ?Final Clinical Impression(s) / ED Diagnoses ?Final diagnoses:  ?Fall, initial encounter  ?Acute pain of left knee  ? ? ?Rx / DC Orders ?ED Discharge Orders   ? ?      Ordered  ?  For home use only DME Walker       ? 08/09/21 1836  ? ?  ?  ? ?  ? ? ?  ?Rodney Booze, PA-C ?08/09/21 1851 ? ?  ?Lorelle Gibbs, DO ?08/09/21 2323 ? ?

## 2021-08-09 NOTE — Progress Notes (Addendum)
.  Transition of Care [facility]) - Emergency Department Mini Assessment ? ? ?Patient Details  ?Name: Laura Dominguez ?MRN: [id number] ?Date of Birth: Feb 09, 1941 ? ?Transition of Care (TOC) CM/SW Contact:    ?Roseanne Kaufman, RN ?Phone Number: ?08/09/2021, 6:40 PM ? ? ?Clinical Narrative: ?Patient presents to ED due to recent fall at home.   ? ? ?ED Mini Assessment: ?  ?RNCM received a call from EDP, TOC consult for walker. Patient's son can stay with her tonight. EDP will place order for DME. This RNCM spoke with Zack at [facility] who can provide walker, via delivery to her home in the am. No additional TOC needs at this time.   ?   ?6:44pm EDP called this RNCM and was able to locate a walker onsite at [facility]. This RNCM called to cancel DME request with Zack. ? ?No additional TOC needs. ? ?Patient Contact and Communications ? ?  ?  ?Admission diagnosis:  Fall ?Patient Active Problem List  ? Diagnosis Date Noted  ? Insomnia due to stress 01/14/2017  ? Nocturia 01/14/2017  ? Hypoxemia during surgery 01/14/2017  ? OSA on CPAP 01/14/2017  ? ?PCP:  Merrilee Seashore, MD ?Pharmacy:   ?[id number]- [place], [place] - [address] ?[address]?Phone: [phone]: [phone]?  ?

## 2021-08-09 NOTE — Discharge Instructions (Signed)
Take '650mg'$  of tylenol every 6 hours for pain. Use cold compresses for 30 minutes at a time for swelling. Keep the leg elevated when sitting.  ? ?Call the orthopedic doctor tomorrow to schedule a follow up appointment within the next week ? ? Please return to the emergency department for any new or worsening symptoms. ? ?

## 2021-08-09 NOTE — ED Triage Notes (Signed)
Pt reports falling while walking into her garage. States she hit her head and left knee on concrete. Not on blood thinners, denies LOC.  ?

## 2021-09-19 DIAGNOSIS — Z961 Presence of intraocular lens: Secondary | ICD-10-CM | POA: Diagnosis not present

## 2021-09-19 DIAGNOSIS — H35373 Puckering of macula, bilateral: Secondary | ICD-10-CM | POA: Diagnosis not present

## 2021-09-19 DIAGNOSIS — H40013 Open angle with borderline findings, low risk, bilateral: Secondary | ICD-10-CM | POA: Diagnosis not present

## 2021-09-19 DIAGNOSIS — H35342 Macular cyst, hole, or pseudohole, left eye: Secondary | ICD-10-CM | POA: Diagnosis not present

## 2021-09-19 DIAGNOSIS — H5213 Myopia, bilateral: Secondary | ICD-10-CM | POA: Diagnosis not present

## 2021-09-19 DIAGNOSIS — H524 Presbyopia: Secondary | ICD-10-CM | POA: Diagnosis not present

## 2021-09-19 DIAGNOSIS — H52223 Regular astigmatism, bilateral: Secondary | ICD-10-CM | POA: Diagnosis not present

## 2021-11-06 DIAGNOSIS — M810 Age-related osteoporosis without current pathological fracture: Secondary | ICD-10-CM | POA: Diagnosis not present

## 2021-11-06 DIAGNOSIS — R5383 Other fatigue: Secondary | ICD-10-CM | POA: Diagnosis not present

## 2021-11-06 DIAGNOSIS — I10 Essential (primary) hypertension: Secondary | ICD-10-CM | POA: Diagnosis not present

## 2021-11-06 DIAGNOSIS — E782 Mixed hyperlipidemia: Secondary | ICD-10-CM | POA: Diagnosis not present

## 2021-11-13 DIAGNOSIS — R7303 Prediabetes: Secondary | ICD-10-CM | POA: Diagnosis not present

## 2021-11-13 DIAGNOSIS — F411 Generalized anxiety disorder: Secondary | ICD-10-CM | POA: Diagnosis not present

## 2021-11-13 DIAGNOSIS — I10 Essential (primary) hypertension: Secondary | ICD-10-CM | POA: Diagnosis not present

## 2021-11-13 DIAGNOSIS — K5730 Diverticulosis of large intestine without perforation or abscess without bleeding: Secondary | ICD-10-CM | POA: Diagnosis not present

## 2021-11-13 DIAGNOSIS — Z0000 Encounter for general adult medical examination without abnormal findings: Secondary | ICD-10-CM | POA: Diagnosis not present

## 2021-11-13 DIAGNOSIS — N182 Chronic kidney disease, stage 2 (mild): Secondary | ICD-10-CM | POA: Diagnosis not present

## 2021-11-13 DIAGNOSIS — M810 Age-related osteoporosis without current pathological fracture: Secondary | ICD-10-CM | POA: Diagnosis not present

## 2021-11-13 DIAGNOSIS — E782 Mixed hyperlipidemia: Secondary | ICD-10-CM | POA: Diagnosis not present

## 2021-11-13 DIAGNOSIS — G4733 Obstructive sleep apnea (adult) (pediatric): Secondary | ICD-10-CM | POA: Diagnosis not present

## 2021-11-13 DIAGNOSIS — G250 Essential tremor: Secondary | ICD-10-CM | POA: Diagnosis not present

## 2022-06-11 DIAGNOSIS — E782 Mixed hyperlipidemia: Secondary | ICD-10-CM | POA: Diagnosis not present

## 2022-06-11 DIAGNOSIS — R7303 Prediabetes: Secondary | ICD-10-CM | POA: Diagnosis not present

## 2022-06-18 DIAGNOSIS — I10 Essential (primary) hypertension: Secondary | ICD-10-CM | POA: Diagnosis not present

## 2022-06-18 DIAGNOSIS — G250 Essential tremor: Secondary | ICD-10-CM | POA: Diagnosis not present

## 2022-06-18 DIAGNOSIS — K5730 Diverticulosis of large intestine without perforation or abscess without bleeding: Secondary | ICD-10-CM | POA: Diagnosis not present

## 2022-06-18 DIAGNOSIS — F411 Generalized anxiety disorder: Secondary | ICD-10-CM | POA: Diagnosis not present

## 2022-06-18 DIAGNOSIS — E782 Mixed hyperlipidemia: Secondary | ICD-10-CM | POA: Diagnosis not present

## 2022-06-18 DIAGNOSIS — M810 Age-related osteoporosis without current pathological fracture: Secondary | ICD-10-CM | POA: Diagnosis not present

## 2022-06-18 DIAGNOSIS — N182 Chronic kidney disease, stage 2 (mild): Secondary | ICD-10-CM | POA: Diagnosis not present

## 2022-06-18 DIAGNOSIS — G4733 Obstructive sleep apnea (adult) (pediatric): Secondary | ICD-10-CM | POA: Diagnosis not present

## 2022-06-18 DIAGNOSIS — R7303 Prediabetes: Secondary | ICD-10-CM | POA: Diagnosis not present

## 2022-06-18 DIAGNOSIS — R5383 Other fatigue: Secondary | ICD-10-CM | POA: Diagnosis not present

## 2022-07-28 IMAGING — DX DG KNEE COMPLETE 4+V*L*
4 series · 4 of 4 positions shown · non-contrast
Comparison: None.

CLINICAL DATA: Trauma, fall, pain

EXAM:
LEFT KNEE - COMPLETE 4+ VIEW

[knee ap]
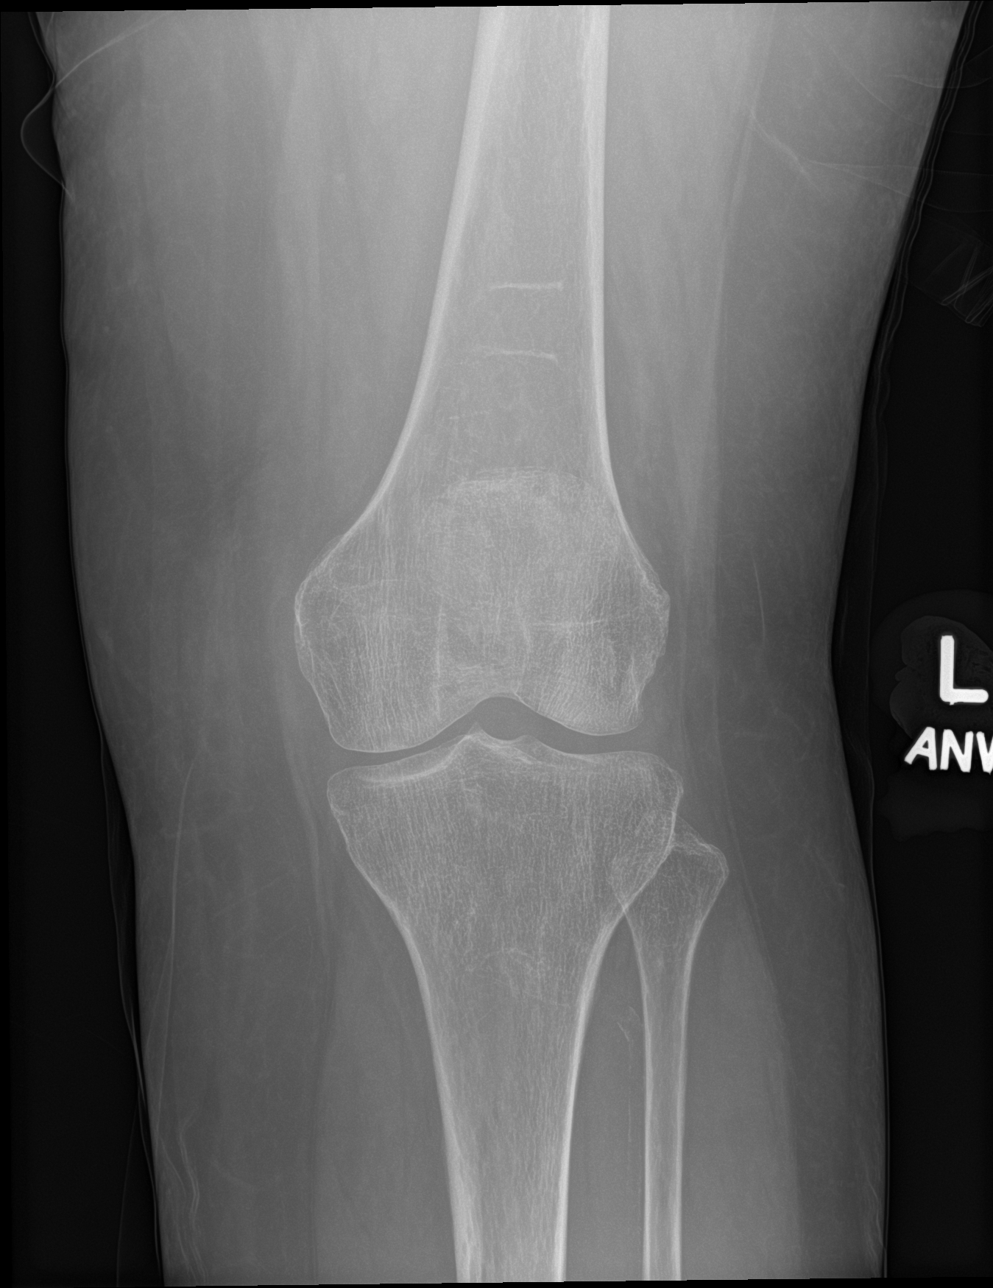

[knee obl (1 of 3)]
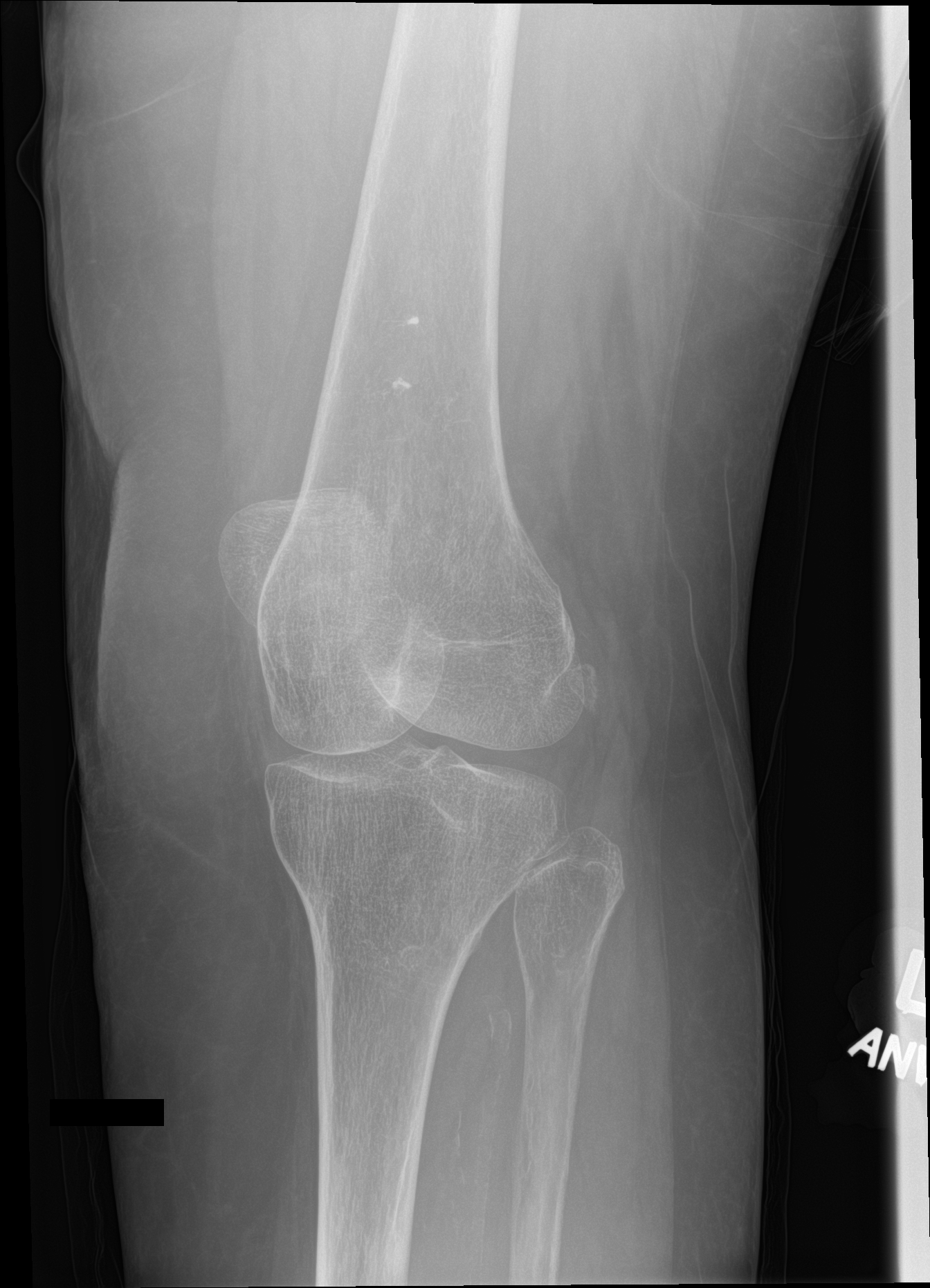

[knee obl (2 of 3)]
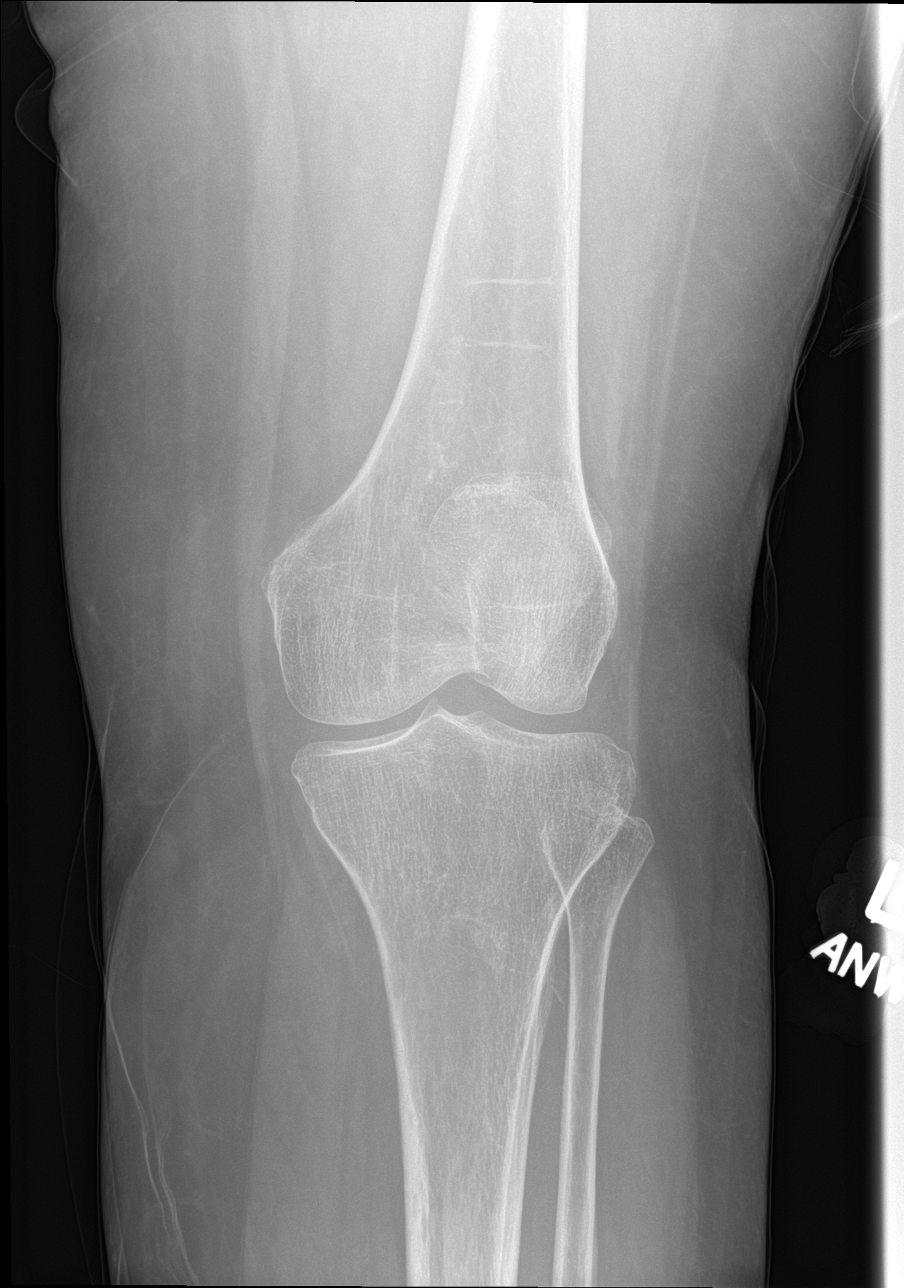

[knee obl (3 of 3)]
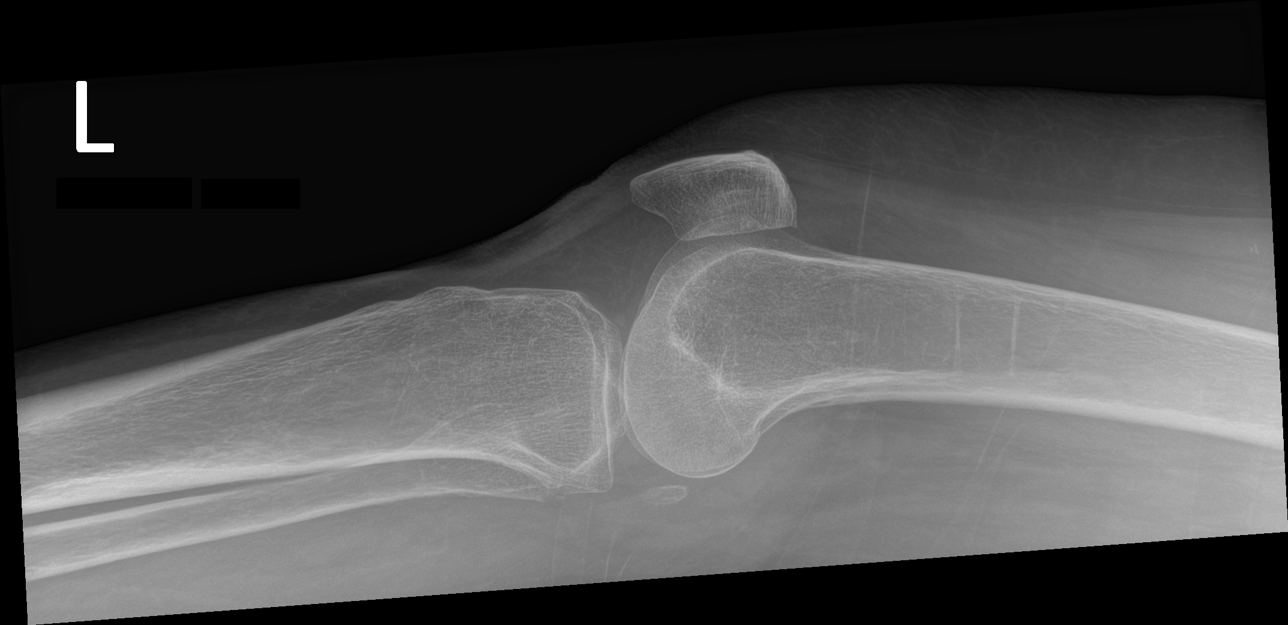

[4 of 4 positions shown; findings below may reference images not displayed]

FINDINGS: No fracture or dislocation is seen. Osteopenia is seen in bony
structures. There is no significant effusion in the suprapatellar
bursa. Vascular calcifications are seen in the soft tissues.
IMPRESSION: No fracture or dislocation is seen. There is no significant
effusion.

## 2022-11-02 DIAGNOSIS — Z961 Presence of intraocular lens: Secondary | ICD-10-CM | POA: Diagnosis not present

## 2022-11-02 DIAGNOSIS — H35373 Puckering of macula, bilateral: Secondary | ICD-10-CM | POA: Diagnosis not present

## 2022-11-02 DIAGNOSIS — H40013 Open angle with borderline findings, low risk, bilateral: Secondary | ICD-10-CM | POA: Diagnosis not present

## 2022-11-02 DIAGNOSIS — H35342 Macular cyst, hole, or pseudohole, left eye: Secondary | ICD-10-CM | POA: Diagnosis not present

## 2022-11-02 DIAGNOSIS — H35361 Drusen (degenerative) of macula, right eye: Secondary | ICD-10-CM | POA: Diagnosis not present

## 2022-11-20 DIAGNOSIS — R7303 Prediabetes: Secondary | ICD-10-CM | POA: Diagnosis not present

## 2022-11-20 DIAGNOSIS — K5730 Diverticulosis of large intestine without perforation or abscess without bleeding: Secondary | ICD-10-CM | POA: Diagnosis not present

## 2022-11-20 DIAGNOSIS — M810 Age-related osteoporosis without current pathological fracture: Secondary | ICD-10-CM | POA: Diagnosis not present

## 2022-11-20 DIAGNOSIS — E782 Mixed hyperlipidemia: Secondary | ICD-10-CM | POA: Diagnosis not present

## 2022-11-20 DIAGNOSIS — R5383 Other fatigue: Secondary | ICD-10-CM | POA: Diagnosis not present

## 2022-11-20 DIAGNOSIS — F411 Generalized anxiety disorder: Secondary | ICD-10-CM | POA: Diagnosis not present

## 2022-11-20 DIAGNOSIS — I10 Essential (primary) hypertension: Secondary | ICD-10-CM | POA: Diagnosis not present

## 2022-11-20 DIAGNOSIS — G250 Essential tremor: Secondary | ICD-10-CM | POA: Diagnosis not present

## 2022-11-20 DIAGNOSIS — N182 Chronic kidney disease, stage 2 (mild): Secondary | ICD-10-CM | POA: Diagnosis not present

## 2022-11-20 DIAGNOSIS — G4733 Obstructive sleep apnea (adult) (pediatric): Secondary | ICD-10-CM | POA: Diagnosis not present

## 2022-11-27 DIAGNOSIS — M810 Age-related osteoporosis without current pathological fracture: Secondary | ICD-10-CM | POA: Diagnosis not present

## 2022-11-27 DIAGNOSIS — G250 Essential tremor: Secondary | ICD-10-CM | POA: Diagnosis not present

## 2022-11-27 DIAGNOSIS — R7303 Prediabetes: Secondary | ICD-10-CM | POA: Diagnosis not present

## 2022-11-27 DIAGNOSIS — F411 Generalized anxiety disorder: Secondary | ICD-10-CM | POA: Diagnosis not present

## 2022-11-27 DIAGNOSIS — G4733 Obstructive sleep apnea (adult) (pediatric): Secondary | ICD-10-CM | POA: Diagnosis not present

## 2022-11-27 DIAGNOSIS — N182 Chronic kidney disease, stage 2 (mild): Secondary | ICD-10-CM | POA: Diagnosis not present

## 2022-11-27 DIAGNOSIS — I10 Essential (primary) hypertension: Secondary | ICD-10-CM | POA: Diagnosis not present

## 2022-11-27 DIAGNOSIS — E782 Mixed hyperlipidemia: Secondary | ICD-10-CM | POA: Diagnosis not present

## 2022-11-27 DIAGNOSIS — K5730 Diverticulosis of large intestine without perforation or abscess without bleeding: Secondary | ICD-10-CM | POA: Diagnosis not present

## 2022-11-27 DIAGNOSIS — Z0000 Encounter for general adult medical examination without abnormal findings: Secondary | ICD-10-CM | POA: Diagnosis not present

## 2023-07-08 DIAGNOSIS — N182 Chronic kidney disease, stage 2 (mild): Secondary | ICD-10-CM | POA: Diagnosis not present

## 2023-07-08 DIAGNOSIS — E782 Mixed hyperlipidemia: Secondary | ICD-10-CM | POA: Diagnosis not present

## 2023-07-08 DIAGNOSIS — I10 Essential (primary) hypertension: Secondary | ICD-10-CM | POA: Diagnosis not present

## 2023-07-08 DIAGNOSIS — R7303 Prediabetes: Secondary | ICD-10-CM | POA: Diagnosis not present

## 2023-07-15 DIAGNOSIS — E782 Mixed hyperlipidemia: Secondary | ICD-10-CM | POA: Diagnosis not present

## 2023-07-15 DIAGNOSIS — R7303 Prediabetes: Secondary | ICD-10-CM | POA: Diagnosis not present

## 2023-07-15 DIAGNOSIS — M810 Age-related osteoporosis without current pathological fracture: Secondary | ICD-10-CM | POA: Diagnosis not present

## 2023-07-15 DIAGNOSIS — G4733 Obstructive sleep apnea (adult) (pediatric): Secondary | ICD-10-CM | POA: Diagnosis not present

## 2023-07-15 DIAGNOSIS — F411 Generalized anxiety disorder: Secondary | ICD-10-CM | POA: Diagnosis not present

## 2023-07-15 DIAGNOSIS — G250 Essential tremor: Secondary | ICD-10-CM | POA: Diagnosis not present

## 2023-07-15 DIAGNOSIS — N182 Chronic kidney disease, stage 2 (mild): Secondary | ICD-10-CM | POA: Diagnosis not present

## 2023-07-15 DIAGNOSIS — I10 Essential (primary) hypertension: Secondary | ICD-10-CM | POA: Diagnosis not present

## 2023-07-15 DIAGNOSIS — K5730 Diverticulosis of large intestine without perforation or abscess without bleeding: Secondary | ICD-10-CM | POA: Diagnosis not present

## 2023-11-08 DIAGNOSIS — H35373 Puckering of macula, bilateral: Secondary | ICD-10-CM | POA: Diagnosis not present

## 2023-11-08 DIAGNOSIS — H40013 Open angle with borderline findings, low risk, bilateral: Secondary | ICD-10-CM | POA: Diagnosis not present

## 2023-11-08 DIAGNOSIS — H35361 Drusen (degenerative) of macula, right eye: Secondary | ICD-10-CM | POA: Diagnosis not present

## 2023-11-08 DIAGNOSIS — H35342 Macular cyst, hole, or pseudohole, left eye: Secondary | ICD-10-CM | POA: Diagnosis not present

## 2023-12-09 DIAGNOSIS — R7303 Prediabetes: Secondary | ICD-10-CM | POA: Diagnosis not present

## 2023-12-09 DIAGNOSIS — E782 Mixed hyperlipidemia: Secondary | ICD-10-CM | POA: Diagnosis not present

## 2023-12-09 DIAGNOSIS — N182 Chronic kidney disease, stage 2 (mild): Secondary | ICD-10-CM | POA: Diagnosis not present

## 2023-12-09 DIAGNOSIS — R5383 Other fatigue: Secondary | ICD-10-CM | POA: Diagnosis not present

## 2023-12-09 DIAGNOSIS — I10 Essential (primary) hypertension: Secondary | ICD-10-CM | POA: Diagnosis not present

## 2023-12-16 DIAGNOSIS — M810 Age-related osteoporosis without current pathological fracture: Secondary | ICD-10-CM | POA: Diagnosis not present

## 2023-12-16 DIAGNOSIS — E782 Mixed hyperlipidemia: Secondary | ICD-10-CM | POA: Diagnosis not present

## 2023-12-16 DIAGNOSIS — G4733 Obstructive sleep apnea (adult) (pediatric): Secondary | ICD-10-CM | POA: Diagnosis not present

## 2023-12-16 DIAGNOSIS — I10 Essential (primary) hypertension: Secondary | ICD-10-CM | POA: Diagnosis not present

## 2023-12-16 DIAGNOSIS — G250 Essential tremor: Secondary | ICD-10-CM | POA: Diagnosis not present

## 2023-12-16 DIAGNOSIS — Z0000 Encounter for general adult medical examination without abnormal findings: Secondary | ICD-10-CM | POA: Diagnosis not present

## 2023-12-16 DIAGNOSIS — F411 Generalized anxiety disorder: Secondary | ICD-10-CM | POA: Diagnosis not present

## 2023-12-16 DIAGNOSIS — R7303 Prediabetes: Secondary | ICD-10-CM | POA: Diagnosis not present

## 2023-12-16 DIAGNOSIS — K5730 Diverticulosis of large intestine without perforation or abscess without bleeding: Secondary | ICD-10-CM | POA: Diagnosis not present

## 2023-12-16 DIAGNOSIS — N182 Chronic kidney disease, stage 2 (mild): Secondary | ICD-10-CM | POA: Diagnosis not present
